# Patient Record
Sex: Male | Born: 1990
Health system: Southern US, Community
[De-identification: ages and names within clinical notes are randomized; demographics above are authoritative.]

## PROBLEM LIST (undated history)

## (undated) DIAGNOSIS — I10 Essential (primary) hypertension: Secondary | ICD-10-CM

## (undated) DIAGNOSIS — S62319A Displaced fracture of base of unspecified metacarpal bone, initial encounter for closed fracture: Secondary | ICD-10-CM

## (undated) DIAGNOSIS — I2510 Atherosclerotic heart disease of native coronary artery without angina pectoris: Secondary | ICD-10-CM

## (undated) HISTORY — PX: APPENDECTOMY: SHX54

---
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
## 2009-08-27 ENCOUNTER — Ambulatory Visit: Payer: Self-pay | Admitting: Radiology

## 2009-08-27 ENCOUNTER — Emergency Department (HOSPITAL_BASED_OUTPATIENT_CLINIC_OR_DEPARTMENT_OTHER): Admission: EM | Admit: 2009-08-27 | Discharge: 2009-08-27 | Payer: Self-pay | Admitting: Emergency Medicine

## 2013-11-14 ENCOUNTER — Emergency Department (HOSPITAL_COMMUNITY)
Admission: EM | Admit: 2013-11-14 | Discharge: 2013-11-14 | Disposition: A | Discharge status: Home / Self Care | Payer: Self-pay | Attending: Emergency Medicine | Admitting: Emergency Medicine

## 2013-11-14 ENCOUNTER — Encounter (HOSPITAL_COMMUNITY): Payer: Self-pay | Admitting: Emergency Medicine

## 2013-11-14 DIAGNOSIS — Z23 Encounter for immunization: Secondary | ICD-10-CM | POA: Insufficient documentation

## 2013-11-14 DIAGNOSIS — IMO0002 Reserved for concepts with insufficient information to code with codable children: Secondary | ICD-10-CM | POA: Insufficient documentation

## 2013-11-14 DIAGNOSIS — I10 Essential (primary) hypertension: Secondary | ICD-10-CM | POA: Insufficient documentation

## 2013-11-14 DIAGNOSIS — F17200 Nicotine dependence, unspecified, uncomplicated: Secondary | ICD-10-CM | POA: Insufficient documentation

## 2013-11-14 DIAGNOSIS — Y9389 Activity, other specified: Secondary | ICD-10-CM | POA: Insufficient documentation

## 2013-11-14 DIAGNOSIS — S01501A Unspecified open wound of lip, initial encounter: Secondary | ICD-10-CM | POA: Insufficient documentation

## 2013-11-14 DIAGNOSIS — Y9229 Other specified public building as the place of occurrence of the external cause: Secondary | ICD-10-CM | POA: Insufficient documentation

## 2013-11-14 DIAGNOSIS — S01409A Unspecified open wound of unspecified cheek and temporomandibular area, initial encounter: Secondary | ICD-10-CM | POA: Insufficient documentation

## 2013-11-14 DIAGNOSIS — S0181XA Laceration without foreign body of other part of head, initial encounter: Secondary | ICD-10-CM

## 2013-11-14 HISTORY — DX: Essential (primary) hypertension: I10

## 2013-11-14 MED ORDER — TETANUS-DIPHTH-ACELL PERTUSSIS 5-2.5-18.5 LF-MCG/0.5 IM SUSP
0.5000 mL | Freq: Once | INTRAMUSCULAR | Status: AC
  Administered 2013-11-14: 0.5 mL via INTRAMUSCULAR
  Filled 2013-11-14: qty 0.5

## 2013-11-14 MED ORDER — BACITRACIN ZINC 500 UNIT/GM EX OINT: 1.0000 "application " | TOPICAL_OINTMENT | Freq: Two times a day (BID) | CUTANEOUS | Status: DC

## 2013-11-14 MED ORDER — OXYCODONE-ACETAMINOPHEN 5-325 MG PO TABS
2.0000 | ORAL_TABLET | Freq: Once | ORAL | Status: AC
  Administered 2013-11-14: 2 via ORAL
  Filled 2013-11-14: qty 2
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
## 2013-11-14 NOTE — Discharge Instructions (Signed)
Facial Laceration ° A facial laceration is a cut on the face. These injuries can be painful and cause bleeding. Lacerations usually heal quickly, but they need special care to reduce scarring. °DIAGNOSIS  °Your health care provider will take a medical history, ask for details about how the injury occurred, and examine the wound to determine how deep the cut is. °TREATMENT  °Some facial lacerations may not require closure. Others may not be able to be closed because of an increased risk of infection. The risk of infection and the chance for successful closure will depend on various factors, including the amount of time since the injury occurred. °The wound may be cleaned to help prevent infection. If closure is appropriate, pain medicines may be given if needed. Your health care provider will use stitches (sutures), wound glue (adhesive), or skin adhesive strips to repair the laceration. These tools bring the skin edges together to allow for faster healing and a better cosmetic outcome. If needed, you may also be given a tetanus shot. °HOME CARE INSTRUCTIONS °· Only take over-the-counter or prescription medicines as directed by your health care provider. °· Follow your health care provider's instructions for wound care. These instructions will vary depending on the technique used for closing the wound. °For Sutures: °· Keep the wound clean and dry.   °· If you were given a bandage (dressing), you should change it at least once a day. Also change the dressing if it becomes wet or dirty, or as directed by your health care provider.   °· Wash the wound with soap and water 2 times a day. Rinse the wound off with water to remove all soap. Pat the wound dry with a clean towel.   °· After cleaning, apply a thin layer of the antibiotic ointment recommended by your health care provider. This will help prevent infection and keep the dressing from sticking.   °· You may shower as usual after the first 24 hours. Do not soak the  wound in water until the sutures are removed.   °· Get your sutures removed as directed by your health care provider. With facial lacerations, sutures should usually be taken out after 4-5 days to avoid stitch marks.   °· Wait a few days after your sutures are removed before applying any makeup. °For Skin Adhesive Strips: °· Keep the wound clean and dry.   °· Do not get the skin adhesive strips wet. You may bathe carefully, using caution to keep the wound dry.   °· If the wound gets wet, pat it dry with a clean towel.   °· Skin adhesive strips will fall off on their own. You may trim the strips as the wound heals. Do not remove skin adhesive strips that are still stuck to the wound. They will fall off in time.   °For Wound Adhesive: °· You may briefly wet your wound in the shower or bath. Do not soak or scrub the wound. Do not swim. Avoid periods of heavy sweating until the skin adhesive has fallen off on its own. After showering or bathing, gently pat the wound dry with a clean towel.   °· Do not apply liquid medicine, cream medicine, ointment medicine, or makeup to your wound while the skin adhesive is in place. This may loosen the film before your wound is healed.   °· If a dressing is placed over the wound, be careful not to apply tape directly over the skin adhesive. This may cause the adhesive to be pulled off before the wound is healed.   °· Avoid   prolonged exposure to sunlight or tanning lamps while the skin adhesive is in place.  The skin adhesive will usually remain in place for 5-10 days, then naturally fall off the skin. Do not pick at the adhesive film.  After Healing: Once the wound has healed, cover the wound with sunscreen during the day for 1 full year. This can help minimize scarring. Exposure to ultraviolet light in the first year will darken the scar. It can take 1-2 years for the scar to lose its redness and to heal completely.  SEEK IMMEDIATE MEDICAL CARE IF:  You have redness, pain, or  swelling around the wound.   You see ayellowish-white fluid (pus) coming from the wound.   You have chills or a fever.  MAKE SURE YOU:  Understand these instructions.  Will watch your condition.  Will get help right away if you are not doing well or get worse. Document Released: 06/08/2004 Document Revised: 02/19/2013 Document Reviewed: 12/12/2012 [facility] Patient Information 2015 [employer] [place]. This information is not intended to replace advice given to you by your health care provider. Make sure you discuss any questions you have with your health care provider.   RETURN FOR SUTURE REMOVAL ON Monday OR Tuesday OF NEXT WEEK.
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
## 2013-11-18 ENCOUNTER — Emergency Department (INDEPENDENT_AMBULATORY_CARE_PROVIDER_SITE_OTHER)
Admission: EM | Admit: 2013-11-18 | Discharge: 2013-11-18 | Disposition: A | Discharge status: Home / Self Care | Payer: Self-pay | Source: Home / Self Care | Attending: Emergency Medicine | Admitting: Emergency Medicine

## 2013-11-18 ENCOUNTER — Encounter (HOSPITAL_COMMUNITY): Payer: Self-pay | Admitting: Emergency Medicine

## 2013-11-18 DIAGNOSIS — S0180XA Unspecified open wound of other part of head, initial encounter: Secondary | ICD-10-CM

## 2013-11-18 DIAGNOSIS — S0181XD Laceration without foreign body of other part of head, subsequent encounter: Secondary | ICD-10-CM

## 2013-11-18 DIAGNOSIS — Z4802 Encounter for removal of sutures: Secondary | ICD-10-CM

## 2013-11-18 DIAGNOSIS — X58XXXA Exposure to other specified factors, initial encounter: Secondary | ICD-10-CM
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
## 2013-11-18 NOTE — Discharge Instructions (Signed)
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
## 2013-11-18 NOTE — ED Provider Notes (Signed)
Medical screening examination/treatment/procedure(s) were performed by non-physician practitioner and as supervising physician I was immediately available for consultation/collaboration.  Tiffany Talarico, M.D.  Welles Walthall C Pranavi Aure, MD 11/18/13 1831 

## 2016-01-17 ENCOUNTER — Encounter (HOSPITAL_COMMUNITY): Payer: Self-pay | Admitting: Emergency Medicine

## 2016-01-17 ENCOUNTER — Emergency Department (HOSPITAL_COMMUNITY)
Admission: EM | Admit: 2016-01-17 | Discharge: 2016-01-17 | Disposition: A | Discharge status: Home / Self Care | Payer: Self-pay | Attending: Emergency Medicine | Admitting: Emergency Medicine

## 2016-01-17 ENCOUNTER — Emergency Department (HOSPITAL_COMMUNITY): Payer: Self-pay

## 2016-01-17 DIAGNOSIS — Y929 Unspecified place or not applicable: Secondary | ICD-10-CM | POA: Insufficient documentation

## 2016-01-17 DIAGNOSIS — Y999 Unspecified external cause status: Secondary | ICD-10-CM | POA: Insufficient documentation

## 2016-01-17 DIAGNOSIS — F17200 Nicotine dependence, unspecified, uncomplicated: Secondary | ICD-10-CM | POA: Insufficient documentation

## 2016-01-17 DIAGNOSIS — I10 Essential (primary) hypertension: Secondary | ICD-10-CM | POA: Insufficient documentation

## 2016-01-17 DIAGNOSIS — Y939 Activity, unspecified: Secondary | ICD-10-CM | POA: Insufficient documentation

## 2016-01-17 DIAGNOSIS — S0101XA Laceration without foreign body of scalp, initial encounter: Secondary | ICD-10-CM | POA: Insufficient documentation

## 2016-01-17 DIAGNOSIS — S0181XA Laceration without foreign body of other part of head, initial encounter: Secondary | ICD-10-CM | POA: Insufficient documentation

## 2016-01-17 MED ORDER — LIDOCAINE-EPINEPHRINE 2 %-1:100000 IJ SOLN: 20.0000 mL | Freq: Once | INTRAMUSCULAR | Status: DC

## 2016-01-17 MED ORDER — LIDOCAINE-EPINEPHRINE (PF) 2 %-1:200000 IJ SOLN
20.0000 mL | Freq: Once | INTRAMUSCULAR | Status: AC
  Administered 2016-01-17: 20 mL via INTRADERMAL
  Filled 2016-01-17: qty 20

## 2016-01-17 MED ORDER — NAPROXEN 500 MG PO TABS: 500.0000 mg | ORAL_TABLET | Freq: Two times a day (BID) | ORAL | 0 refills | Status: DC
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
## 2016-01-17 NOTE — ED Notes (Signed)
No reaction to medication noted alert and oriented x 3 steady gait noted no respiratory or acute distress noted.

## 2016-08-13 DIAGNOSIS — S62319A Displaced fracture of base of unspecified metacarpal bone, initial encounter for closed fracture: Secondary | ICD-10-CM

## 2016-08-13 HISTORY — DX: Displaced fracture of base of unspecified metacarpal bone, initial encounter for closed fracture: S62.319A

## 2016-08-15 ENCOUNTER — Emergency Department (HOSPITAL_COMMUNITY): Payer: Self-pay

## 2016-08-15 ENCOUNTER — Encounter (HOSPITAL_COMMUNITY): Payer: Self-pay | Admitting: Emergency Medicine

## 2016-08-15 ENCOUNTER — Emergency Department (HOSPITAL_COMMUNITY)
Admission: EM | Admit: 2016-08-15 | Discharge: 2016-08-15 | Disposition: A | Discharge status: Home / Self Care | Payer: Self-pay | Attending: Emergency Medicine | Admitting: Emergency Medicine

## 2016-08-15 DIAGNOSIS — I10 Essential (primary) hypertension: Secondary | ICD-10-CM | POA: Insufficient documentation

## 2016-08-15 DIAGNOSIS — Z79899 Other long term (current) drug therapy: Secondary | ICD-10-CM | POA: Insufficient documentation

## 2016-08-15 DIAGNOSIS — Y939 Activity, unspecified: Secondary | ICD-10-CM | POA: Insufficient documentation

## 2016-08-15 DIAGNOSIS — Y999 Unspecified external cause status: Secondary | ICD-10-CM | POA: Insufficient documentation

## 2016-08-15 DIAGNOSIS — S62309A Unspecified fracture of unspecified metacarpal bone, initial encounter for closed fracture: Secondary | ICD-10-CM

## 2016-08-15 DIAGNOSIS — F17200 Nicotine dependence, unspecified, uncomplicated: Secondary | ICD-10-CM | POA: Insufficient documentation

## 2016-08-15 DIAGNOSIS — S62316A Displaced fracture of base of fifth metacarpal bone, right hand, initial encounter for closed fracture: Secondary | ICD-10-CM | POA: Insufficient documentation

## 2016-08-15 DIAGNOSIS — S62324A Displaced fracture of shaft of fourth metacarpal bone, right hand, initial encounter for closed fracture: Secondary | ICD-10-CM | POA: Insufficient documentation

## 2016-08-15 DIAGNOSIS — Y929 Unspecified place or not applicable: Secondary | ICD-10-CM | POA: Insufficient documentation

## 2016-08-15 MED ORDER — HYDROCODONE-ACETAMINOPHEN 5-325 MG PO TABS: 1.0000 | ORAL_TABLET | ORAL | 0 refills | Status: AC | PRN

## 2016-08-15 MED ORDER — HYDROCODONE-ACETAMINOPHEN 5-325 MG PO TABS
1.0000 | ORAL_TABLET | Freq: Once | ORAL | Status: AC
Start: 2016-08-15 — End: 2016-08-15
  Administered 2016-08-15: 1 via ORAL
  Filled 2016-08-15: qty 1
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
## 2016-08-15 NOTE — Progress Notes (Signed)
Orthopedic Tech Progress Note Patient Details:  Alec Henderson [birth year] [id number]  Ortho Devices Type of Ortho Device: Arm sling, Ulna gutter splint Ortho Device/Splint Location: rue Ortho Device/Splint Interventions: Ordered, Application   Trinna Post 08/15/2016, 4:07 AM

## 2016-08-15 NOTE — ED Notes (Signed)
Patient a/o x4 and denies pain upon discharge. RN went over AVS with patient and all questions were answered.
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
## 2016-08-15 NOTE — Discharge Instructions (Signed)
Dr. Carollee Massed office will call you tomorrow. Wear the splint as prescribed. Follow up with Dr. Janee Morn. Return to the ED if you develop new or worsening symptoms.

## 2016-08-16 ENCOUNTER — Other Ambulatory Visit: Payer: Self-pay | Admitting: Orthopedic Surgery

## 2016-08-16 ENCOUNTER — Encounter (HOSPITAL_BASED_OUTPATIENT_CLINIC_OR_DEPARTMENT_OTHER): Payer: Self-pay | Admitting: *Deleted
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
## 2016-08-21 ENCOUNTER — Ambulatory Visit (HOSPITAL_BASED_OUTPATIENT_CLINIC_OR_DEPARTMENT_OTHER): Payer: Self-pay | Admitting: Anesthesiology

## 2016-08-21 ENCOUNTER — Encounter (HOSPITAL_BASED_OUTPATIENT_CLINIC_OR_DEPARTMENT_OTHER): Payer: Self-pay | Admitting: Anesthesiology

## 2016-08-21 ENCOUNTER — Ambulatory Visit (HOSPITAL_BASED_OUTPATIENT_CLINIC_OR_DEPARTMENT_OTHER)
Admission: RE | Admit: 2016-08-21 | Discharge: 2016-08-21 | Disposition: A | Discharge status: Home / Self Care | Payer: Self-pay | Source: Ambulatory Visit | Attending: Orthopedic Surgery | Admitting: Orthopedic Surgery

## 2016-08-21 ENCOUNTER — Ambulatory Visit (HOSPITAL_COMMUNITY): Payer: Self-pay

## 2016-08-21 ENCOUNTER — Encounter (HOSPITAL_BASED_OUTPATIENT_CLINIC_OR_DEPARTMENT_OTHER)
Admission: RE | Disposition: A | Discharge status: Home / Self Care | Payer: Self-pay | Source: Ambulatory Visit | Attending: Orthopedic Surgery

## 2016-08-21 DIAGNOSIS — S62324A Displaced fracture of shaft of fourth metacarpal bone, right hand, initial encounter for closed fracture: Secondary | ICD-10-CM | POA: Insufficient documentation

## 2016-08-21 DIAGNOSIS — W228XXA Striking against or struck by other objects, initial encounter: Secondary | ICD-10-CM | POA: Insufficient documentation

## 2016-08-21 DIAGNOSIS — X58XXXA Exposure to other specified factors, initial encounter: Secondary | ICD-10-CM | POA: Insufficient documentation

## 2016-08-21 DIAGNOSIS — S62316A Displaced fracture of base of fifth metacarpal bone, right hand, initial encounter for closed fracture: Secondary | ICD-10-CM | POA: Insufficient documentation

## 2016-08-21 DIAGNOSIS — F17210 Nicotine dependence, cigarettes, uncomplicated: Secondary | ICD-10-CM | POA: Insufficient documentation

## 2016-08-21 DIAGNOSIS — Z419 Encounter for procedure for purposes other than remedying health state, unspecified: Secondary | ICD-10-CM

## 2016-08-21 HISTORY — DX: Displaced fracture of base of unspecified metacarpal bone, initial encounter for closed fracture: S62.319A

## 2016-08-21 HISTORY — PX: CLOSED REDUCTION FINGER WITH PERCUTANEOUS PINNING: SHX5612

## 2016-08-21 SURGERY — CLOSED REDUCTION, FINGER, WITH PERCUTANEOUS PINNING
Anesthesia: General | Site: Finger | Laterality: Right

## 2016-08-21 MED ORDER — PROPOFOL 500 MG/50ML IV EMUL
INTRAVENOUS | Status: AC
  Filled 2016-08-21: qty 50

## 2016-08-21 MED ORDER — METOCLOPRAMIDE HCL 5 MG/ML IJ SOLN: 10.0000 mg | Freq: Once | INTRAMUSCULAR | Status: DC | PRN

## 2016-08-21 MED ORDER — LACTATED RINGERS IV SOLN
INTRAVENOUS | Status: DC
  Administered 2016-08-21: 09:00:00 via INTRAVENOUS

## 2016-08-21 MED ORDER — SCOPOLAMINE 1 MG/3DAYS TD PT72: 1.0000 | MEDICATED_PATCH | Freq: Once | TRANSDERMAL | Status: DC | PRN

## 2016-08-21 MED ORDER — OXYCODONE HCL 5 MG PO TABS: 5.0000 mg | ORAL_TABLET | Freq: Four times a day (QID) | ORAL | 0 refills | Status: AC | PRN

## 2016-08-21 MED ORDER — MIDAZOLAM HCL 2 MG/2ML IJ SOLN
INTRAMUSCULAR | Status: AC
  Filled 2016-08-21: qty 2

## 2016-08-21 MED ORDER — LIDOCAINE 2% (20 MG/ML) 5 ML SYRINGE
INTRAMUSCULAR | Status: AC
  Filled 2016-08-21: qty 5

## 2016-08-21 MED ORDER — FENTANYL CITRATE (PF) 100 MCG/2ML IJ SOLN
INTRAMUSCULAR | Status: AC
  Filled 2016-08-21: qty 2

## 2016-08-21 MED ORDER — ACETAMINOPHEN 325 MG PO TABS: 650.0000 mg | ORAL_TABLET | Freq: Four times a day (QID) | ORAL | Status: AC | PRN

## 2016-08-21 MED ORDER — FENTANYL CITRATE (PF) 100 MCG/2ML IJ SOLN: 25.0000 ug | INTRAMUSCULAR | Status: DC | PRN

## 2016-08-21 MED ORDER — IBUPROFEN 200 MG PO TABS: 600.0000 mg | ORAL_TABLET | Freq: Four times a day (QID) | ORAL | 0 refills | Status: AC | PRN

## 2016-08-21 MED ORDER — SUCCINYLCHOLINE CHLORIDE 200 MG/10ML IV SOSY
PREFILLED_SYRINGE | INTRAVENOUS | Status: AC
  Filled 2016-08-21: qty 10

## 2016-08-21 MED ORDER — DEXAMETHASONE SODIUM PHOSPHATE 10 MG/ML IJ SOLN
INTRAMUSCULAR | Status: DC | PRN
  Administered 2016-08-21: 10 mg via INTRAVENOUS

## 2016-08-21 MED ORDER — CEFAZOLIN SODIUM-DEXTROSE 2-4 GM/100ML-% IV SOLN
2.0000 g | INTRAVENOUS | Status: AC
  Administered 2016-08-21: 2 g via INTRAVENOUS

## 2016-08-21 MED ORDER — FENTANYL CITRATE (PF) 100 MCG/2ML IJ SOLN
50.0000 ug | INTRAMUSCULAR | Status: AC | PRN
  Administered 2016-08-21: 50 ug via INTRAVENOUS
  Administered 2016-08-21: 100 ug via INTRAVENOUS
  Administered 2016-08-21: 50 ug via INTRAVENOUS

## 2016-08-21 MED ORDER — EPHEDRINE 5 MG/ML INJ
INTRAVENOUS | Status: AC
Start: 2016-08-21 — End: 2016-08-21
  Filled 2016-08-21: qty 10

## 2016-08-21 MED ORDER — MIDAZOLAM HCL 2 MG/2ML IJ SOLN
1.0000 mg | INTRAMUSCULAR | Status: DC | PRN
  Administered 2016-08-21 (×2): 1 mg via INTRAVENOUS
  Administered 2016-08-21: 2 mg via INTRAVENOUS

## 2016-08-21 MED ORDER — LACTATED RINGERS IV SOLN: INTRAVENOUS | Status: DC

## 2016-08-21 MED ORDER — DEXAMETHASONE SODIUM PHOSPHATE 10 MG/ML IJ SOLN
INTRAMUSCULAR | Status: AC
  Filled 2016-08-21: qty 1

## 2016-08-21 MED ORDER — LACTATED RINGERS IV SOLN
INTRAVENOUS | Status: DC
  Administered 2016-08-21 (×2): via INTRAVENOUS

## 2016-08-21 MED ORDER — CEFAZOLIN SODIUM-DEXTROSE 2-4 GM/100ML-% IV SOLN
INTRAVENOUS | Status: AC
  Filled 2016-08-21: qty 100

## 2016-08-21 MED ORDER — PHENYLEPHRINE 40 MCG/ML (10ML) SYRINGE FOR IV PUSH (FOR BLOOD PRESSURE SUPPORT)
PREFILLED_SYRINGE | INTRAVENOUS | Status: AC
  Filled 2016-08-21: qty 10

## 2016-08-21 MED ORDER — BUPIVACAINE-EPINEPHRINE (PF) 0.5% -1:200000 IJ SOLN
INTRAMUSCULAR | Status: DC | PRN
  Administered 2016-08-21: 30 mL via PERINEURAL

## 2016-08-21 MED ORDER — ONDANSETRON HCL 4 MG/2ML IJ SOLN
INTRAMUSCULAR | Status: AC
  Filled 2016-08-21: qty 2

## 2016-08-21 MED ORDER — MEPERIDINE HCL 25 MG/ML IJ SOLN: 6.2500 mg | INTRAMUSCULAR | Status: DC | PRN

## 2016-08-21 MED ORDER — PROPOFOL 10 MG/ML IV BOLUS
INTRAVENOUS | Status: DC | PRN
  Administered 2016-08-21: 100 mg via INTRAVENOUS
  Administered 2016-08-21: 200 mg via INTRAVENOUS

## 2016-08-21 SURGICAL SUPPLY — 50 items
BLADE MINI RND TIP GREEN BEAV (BLADE) IMPLANT
BLADE SURG 15 STRL LF DISP TIS (BLADE) ×1 IMPLANT
BLADE SURG 15 STRL SS (BLADE) ×2
BNDG COHESIVE 4X5 TAN STRL (GAUZE/BANDAGES/DRESSINGS) ×3 IMPLANT
BNDG ESMARK 4X9 LF (GAUZE/BANDAGES/DRESSINGS) ×3 IMPLANT
BNDG GAUZE ELAST 4 BULKY (GAUZE/BANDAGES/DRESSINGS) ×3 IMPLANT
CANISTER SUCTION 1200CC (MISCELLANEOUS) IMPLANT
CHLORAPREP W/TINT 26ML (MISCELLANEOUS) ×3 IMPLANT
CORDS BIPOLAR (ELECTRODE) ×3 IMPLANT
COVER BACK TABLE 60X90IN (DRAPES) ×3 IMPLANT
COVER MAYO STAND STRL (DRAPES) ×3 IMPLANT
CUFF TOURNIQUET SINGLE 18IN (TOURNIQUET CUFF) ×3 IMPLANT
DRAPE C-ARM 42X72 X-RAY (DRAPES) ×3 IMPLANT
DRAPE EXTREMITY T 121X128X90 (DRAPE) ×3 IMPLANT
DRAPE SURG 17X23 STRL (DRAPES) ×3 IMPLANT
DRSG EMULSION OIL 3X3 NADH (GAUZE/BANDAGES/DRESSINGS) ×3 IMPLANT
GAUZE SPONGE 4X4 12PLY STRL LF (GAUZE/BANDAGES/DRESSINGS) ×3 IMPLANT
GLOVE BIO SURGEON STRL SZ7.5 (GLOVE) ×3 IMPLANT
GLOVE BIOGEL PI IND STRL 7.0 (GLOVE) ×2 IMPLANT
GLOVE BIOGEL PI IND STRL 8 (GLOVE) ×2 IMPLANT
GLOVE BIOGEL PI INDICATOR 7.0 (GLOVE) ×4
GLOVE BIOGEL PI INDICATOR 8 (GLOVE) ×4
GLOVE ECLIPSE 6.5 STRL STRAW (GLOVE) ×6 IMPLANT
GOWN STRL REUS W/ TWL LRG LVL3 (GOWN DISPOSABLE) ×2 IMPLANT
GOWN STRL REUS W/TWL LRG LVL3 (GOWN DISPOSABLE) ×4
GOWN STRL REUS W/TWL XL LVL3 (GOWN DISPOSABLE) ×3 IMPLANT
K-WIRE .045X4 (WIRE) ×12 IMPLANT
K-WIRE .062X4 (WIRE) ×3 IMPLANT
NEEDLE HYPO 25X1 1.5 SAFETY (NEEDLE) IMPLANT
NS IRRIG 1000ML POUR BTL (IV SOLUTION) ×3 IMPLANT
PACK BASIN DAY SURGERY FS (CUSTOM PROCEDURE TRAY) ×3 IMPLANT
PADDING CAST ABS 4INX4YD NS (CAST SUPPLIES)
PADDING CAST ABS COTTON 4X4 ST (CAST SUPPLIES) IMPLANT
SLEEVE SCD COMPRESS KNEE MED (MISCELLANEOUS) ×3 IMPLANT
SLING ARM FOAM STRAP LRG (SOFTGOODS) ×3 IMPLANT
SPLINT PLASTER CAST XFAST 3X15 (CAST SUPPLIES) ×11 IMPLANT
SPLINT PLASTER XTRA FASTSET 3X (CAST SUPPLIES) ×22
STOCKINETTE 6  STRL (DRAPES) ×2
STOCKINETTE 6 STRL (DRAPES) ×1 IMPLANT
SUCTION FRAZIER HANDLE 10FR (MISCELLANEOUS)
SUCTION TUBE FRAZIER 10FR DISP (MISCELLANEOUS) IMPLANT
SUT VICRYL RAPIDE 4-0 (SUTURE) IMPLANT
SUT VICRYL RAPIDE 4/0 PS 2 (SUTURE) IMPLANT
SYR 10ML LL (SYRINGE) IMPLANT
SYR BULB 3OZ (MISCELLANEOUS) ×3 IMPLANT
TOWEL OR 17X24 6PK STRL BLUE (TOWEL DISPOSABLE) ×3 IMPLANT
TOWEL OR NON WOVEN STRL DISP B (DISPOSABLE) IMPLANT
TUBE CONNECTING 20'X1/4 (TUBING)
TUBE CONNECTING 20X1/4 (TUBING) IMPLANT
UNDERPAD 30X30 (UNDERPADS AND DIAPERS) ×3 IMPLANT

## 2016-08-21 NOTE — Anesthesia Preprocedure Evaluation (Signed)
Anesthesia Evaluation  Patient identified by MRN, date of birth, ID band Patient awake    Reviewed: Allergy & Precautions, NPO status , Patient's Chart, lab work & pertinent test results  Airway Mallampati: II  TM Distance: >3 FB Neck ROM: Full    Dental no notable dental hx.    Pulmonary Current Smoker,    Pulmonary exam normal breath sounds clear to auscultation       Cardiovascular hypertension, Normal cardiovascular exam Rhythm:Regular Rate:Normal     Neuro/Psych negative neurological ROS  negative psych ROS   GI/Hepatic negative GI ROS, Neg liver ROS,   Endo/Other  negative endocrine ROS  Renal/GU negative Renal ROS  negative genitourinary   Musculoskeletal negative musculoskeletal ROS (+)   Abdominal   Peds negative pediatric ROS (+)  Hematology negative hematology ROS (+)   Anesthesia Other Findings   Reproductive/Obstetrics negative OB ROS                             Anesthesia Physical Anesthesia Plan  ASA: II  Anesthesia Plan: General   Post-op Pain Management:  Regional for Post-op pain   Induction: Intravenous  Airway Management Planned: LMA  Additional Equipment:   Intra-op Plan:   Post-operative Plan: Extubation in OR  Informed Consent: I have reviewed the patients History and Physical, chart, labs and discussed the procedure including the risks, benefits and alternatives for the proposed anesthesia with the patient or authorized representative who has indicated his/her understanding and acceptance.   Dental advisory given  Plan Discussed with: CRNA  Anesthesia Plan Comments: (SCB)        Anesthesia Quick Evaluation

## 2016-08-21 NOTE — Progress Notes (Signed)
Assisted Dr. Carignan with right, ultrasound guided, supraclavicular block. Side rails up, monitors on throughout procedure. See vital signs in flow sheet. Tolerated Procedure well. 

## 2016-08-21 NOTE — Op Note (Signed)
08/21/2016  10:32 AM  PATIENT:  Alec Henderson  26 y.o. male  PRE-OPERATIVE DIAGNOSIS:  Displaced R 4th MC shaft fx and 5th MC base fx  POST-OPERATIVE DIAGNOSIS:  Same  PROCEDURE:   1. CRPP R 4 MC shaft fx    2. CRPP R 5 MC base fx  SURGEON: Antonia Culbertson A. Janee Morn, MD  PHYSICIAN ASSISTANT: Danielle Rankin, OPA-C  ANESTHESIA:  regional and general  SPECIMENS:  None  DRAINS:   None  EBL:  less than 50 mL  PREOPERATIVE INDICATIONS:  Alec Henderson is a  26 y.o. male with displaced fractures of the right fourth and fifth metacarpals  The risks benefits and alternatives were discussed with the patient preoperatively including but not limited to the risks of infection, bleeding, nerve injury, cardiopulmonary complications, the need for revision surgery, among others, and the patient verbalized understanding and consented to proceed.  OPERATIVE IMPLANTS: 0.062 inch K wire 1, 0.045 inch K wires 2  OPERATIVE PROCEDURE:  After receiving prophylactic antibiotics and a regional block, the patient was escorted to the operative theatre and placed in a supine position.  General anesthesia was administered.  A surgical "time-out" was performed during which the planned procedure, proposed operative site, and the correct patient identity were compared to the operative consent and agreement confirmed by the circulating nurse according to current facility policy.  Following application of a tourniquet to the operative extremity, the exposed skin was pre-scrubbed with a Hibiclens scrub brush before being formally prepped with Chloraprep and draped in the usual sterile fashion.  The limb was exsanguinated with an Esmarch bandage and the tourniquet inflated to approximately higher than systolic BP.  The base of the fourth metacarpal was marked fluoroscopically and a small stab incision made just proximal to it.  Spreading dissection was carried down to the dorsal cortex of the base of the fourth metacarpal  and a 0.062 inch K wire was used to drive an oblique hole into the fourth metacarpal, entering the canal.  The K wire was removed, the sharp and clipped off and bent into a skid shape, and it was reinserted by hand into chock down the shaft of the metacarpal, across the fracture site while maintaining reduction, and into the distal aspect of the metacarpal.  Axial load was applied to compress the fracture site and the K wire was bent 90, 90 and clipped.  The base of the fifth metacarpal fracture was then reduced with a towel clip and longitudinal traction.  A single 0.045 inch K wire was driven across the base fragments to help secure the articular reduction and then another transverse K wire was driven from the proximal shaft of the fifth into the proximal shaft of the fourth, with longitudinal traction applied.  This would help neutralize displacement forces at the base.  These K wires were bent over at the skin and clipped.  Final fluoroscopic images were obtained and a short arm splint dressing was applied with both volar and dorsal plaster component.  The digits had excellent rotation and were free to move.  He was awakened and taken to the recovery room in stable condition, breathing spontaneously  DISPOSITION : He will be discharged home today with typical postop instructions, returning in 10-15 days with new x-rays of right hand out of splint and conversion to short arm cast.

## 2016-08-21 NOTE — Discharge Instructions (Signed)
Discharge Instructions   You have a dressing with a plaster splint incorporated in it. Move your fingers as much as possible, making a full fist and fully opening the fist. Elevate your hand above your elbow to reduce pain & swelling of the digits.  Ice over the operative site or in your arm pit may be helpful to reduce pain & swelling.  DO NOT USE HEAT. Take 600 mg ibuprofen and 650 mg of Tylenol EVERY 6 hours OTC. Utilize the oxycodone for SEVERE breakthrough pain only.  Leave the dressing in place until you return to our office.  You may shower, but keep the bandage clean & dry.  You may drive a car when you are off of prescription pain medications and can safely control your vehicle with both hands. Make an appointment for 10-15 days from the date of surgery.   Please call [phone] during normal business hours or [phone] after hours for any problems. Including the following:  - excessive redness of the incisions - drainage for more than 4 days - fever of more than 101.5 F  *Please note that pain medications will not be refilled after hours or on weekends.  Regional Anesthesia Blocks  1. Numbness or the inability to move the "blocked" extremity may last from 3-48 hours after placement. The length of time depends on the medication injected and your individual response to the medication. If the numbness is not going away after 48 hours, call your surgeon.  2. The extremity that is blocked will need to be protected until the numbness is gone and the  Strength has returned. Because you cannot feel it, you will need to take extra care to avoid injury. Because it may be weak, you may have difficulty moving it or using it. You may not know what position it is in without looking at it while the block is in effect.  3. For blocks in the legs and feet, returning to weight bearing and walking needs to be done carefully. You will need to wait until the numbness is entirely gone and the  strength has returned. You should be able to move your leg and foot normally before you try and bear weight or walk. You will need someone to be with you when you first try to ensure you do not fall and possibly risk injury.  4. Bruising and tenderness at the needle site are common side effects and will resolve in a few days.  5. Persistent numbness or new problems with movement should be communicated to the surgeon or the [facility] [phone] [facility] [phone]).   Post Anesthesia Home Care Instructions  Activity: Get plenty of rest for the remainder of the day. A responsible individual must stay with you for 24 hours following the procedure.  For the next 24 hours, DO NOT: -Drive a car -Advertising copywriter -Drink alcoholic beverages -Take any medication unless instructed by your physician -Make any legal decisions or sign important papers.  Meals: Start with liquid foods such as gelatin or soup. Progress to regular foods as tolerated. Avoid greasy, spicy, heavy foods. If nausea and/or vomiting occur, drink only clear liquids until the nausea and/or vomiting subsides. Call your physician if vomiting continues.  Special Instructions/Symptoms: Your throat may feel dry or sore from the anesthesia or the breathing tube placed in your throat during surgery. If this causes discomfort, gargle with warm salt water. The discomfort should disappear within 24 hours.  If you had a  scopolamine patch placed behind your ear for the management of post- operative nausea and/or vomiting:  1. The medication in the patch is effective for 72 hours, after which it should be removed.  Wrap patch in a tissue and discard in the trash. Wash hands thoroughly with soap and water. 2. You may remove the patch earlier than 72 hours if you experience unpleasant side effects which may include dry mouth, dizziness or visual disturbances. 3. Avoid touching the patch. Wash your hands  with soap and water after contact with the patch.

## 2016-08-21 NOTE — Anesthesia Procedure Notes (Signed)
Procedure Name: LMA Insertion Date/Time: 08/21/2016 10:48 AM Performed by: Zenia Resides D Pre-anesthesia Checklist: Patient identified, Emergency Drugs available, Suction available and Patient being monitored Patient Re-evaluated:Patient Re-evaluated prior to inductionOxygen Delivery Method: Circle system utilized Preoxygenation: Pre-oxygenation with 100% oxygen Intubation Type: IV induction Ventilation: Mask ventilation without difficulty LMA: LMA inserted LMA Size: 4.0 Number of attempts: 1 Airway Equipment and Method: Bite block Placement Confirmation: positive ETCO2 Tube secured with: Tape Dental Injury: Teeth and Oropharynx as per pre-operative assessment

## 2016-08-21 NOTE — Transfer of Care (Signed)
Immediate Anesthesia Transfer of Care Note  Patient: Alec Henderson  Procedure(s) Performed: Procedure(s) with comments: OPEN TREATMENT RIGHT 4TH AND 5TH METACARPAL FRACTURES VS. PINNING (Right) - GENERAL ANESTHESIA WITH PRE-OP BLOCK  Patient Location: PACU  Anesthesia Type:General  Level of Consciousness: sedated  Airway & Oxygen Therapy: Patient Spontanous Breathing and Patient connected to face mask oxygen  Post-op Assessment: Report given to RN and Post -op Vital signs reviewed and stable  Post vital signs: Reviewed and stable  Last Vitals:  Vitals:   08/21/16 0945 08/21/16 1000  BP: (!) 141/79 (!) 160/99  Pulse: 74 75  Resp: 20 18  Temp:      Last Pain:  Vitals:   08/21/16 0914  TempSrc: Oral  PainSc: 9          Complications: No apparent anesthesia complications

## 2016-08-21 NOTE — Interval H&P Note (Signed)
History and Physical Interval Note:  08/21/2016 10:32 AM  Alec Henderson  has presented today for surgery, with the diagnosis of RIGHT 4TH AND 5TH BASE METACARPAL FRACTURES S62.354A, S62.616A  The various methods of treatment have been discussed with the patient and family. After consideration of risks, benefits and other options for treatment, the patient has consented to  Procedure(s) with comments: OPEN TREATMENT RIGHT 4TH AND 5TH METACARPAL FRACTURES VS. PINNING (Right) - GENERAL ANESTHESIA WITH PRE-OP BLOCK as a surgical intervention .  The patient's history has been reviewed, patient examined, no change in status, stable for surgery.  I have reviewed the patient's chart and labs.  Questions were answered to the patient's satisfaction.     Ronnell Clinger A.

## 2016-08-21 NOTE — Anesthesia Postprocedure Evaluation (Signed)
Anesthesia Post Note  Patient: Berkley Cronkright  Procedure(s) Performed: Procedure(s) (LRB): CLOSED REDUCTION AND PINNING, FOURTH AND FIFTH METACARPLES (Right)  Patient location during evaluation: PACU Anesthesia Type: General and Regional Level of consciousness: awake and alert Pain management: pain level controlled Vital Signs Assessment: post-procedure vital signs reviewed and stable Respiratory status: spontaneous breathing, nonlabored ventilation, respiratory function stable and patient connected to nasal cannula oxygen Cardiovascular status: blood pressure returned to baseline and stable Postop Assessment: no signs of nausea or vomiting Anesthetic complications: no       Last Vitals:  Vitals:   08/21/16 1215 08/21/16 1233  BP: 136/90 (!) 144/91  Pulse: 74 74  Resp: 17 16  Temp:  36.4 C    Last Pain:  Vitals:   08/21/16 1233  TempSrc: Oral  PainSc: 0-No pain                 Phillips Grout

## 2016-08-21 NOTE — Anesthesia Procedure Notes (Signed)
Anesthesia Regional Block: Supraclavicular block   Pre-Anesthetic Checklist: ,, timeout performed, Correct Patient, Correct Site, Correct Laterality, Correct Procedure, Correct Position, site marked, Risks and benefits discussed,  Surgical consent,  Pre-op evaluation,  At surgeon's request and post-op pain management  Laterality: Right and Upper  Prep: Maximum Sterile Barrier Precautions used, chloraprep       Needles:  Injection technique: Single-shot  Needle Type: Echogenic Stimulator Needle     Needle Length: 10cm      Additional Needles:   Procedures: ultrasound guided,,,,,,,,  Narrative:  Start time: 08/21/2016 9:42 AM End time: 08/21/2016 9:52 AM Injection made incrementally with aspirations every 5 mL.  Performed by: Personally  Anesthesiologist: Phillips Grout  Additional Notes: Risks, benefits and alternative to block explained extensively.  Patient tolerated procedure well, without complications.

## 2016-08-22 ENCOUNTER — Encounter (HOSPITAL_BASED_OUTPATIENT_CLINIC_OR_DEPARTMENT_OTHER): Payer: Self-pay | Admitting: Orthopedic Surgery

## 2017-05-13 ENCOUNTER — Emergency Department (HOSPITAL_COMMUNITY): Payer: Self-pay

## 2017-05-13 ENCOUNTER — Encounter (HOSPITAL_COMMUNITY): Payer: Self-pay | Admitting: Emergency Medicine

## 2017-05-13 ENCOUNTER — Emergency Department (HOSPITAL_COMMUNITY)
Admission: EM | Admit: 2017-05-13 | Discharge: 2017-05-14 | Disposition: A | Discharge status: Home / Self Care | Payer: Self-pay | Attending: Emergency Medicine | Admitting: Emergency Medicine

## 2017-05-13 DIAGNOSIS — Y929 Unspecified place or not applicable: Secondary | ICD-10-CM | POA: Insufficient documentation

## 2017-05-13 DIAGNOSIS — F17210 Nicotine dependence, cigarettes, uncomplicated: Secondary | ICD-10-CM | POA: Insufficient documentation

## 2017-05-13 DIAGNOSIS — Y939 Activity, unspecified: Secondary | ICD-10-CM | POA: Insufficient documentation

## 2017-05-13 DIAGNOSIS — W2209XA Striking against other stationary object, initial encounter: Secondary | ICD-10-CM | POA: Insufficient documentation

## 2017-05-13 DIAGNOSIS — Y999 Unspecified external cause status: Secondary | ICD-10-CM | POA: Insufficient documentation

## 2017-05-13 DIAGNOSIS — S62354A Nondisplaced fracture of shaft of fourth metacarpal bone, right hand, initial encounter for closed fracture: Secondary | ICD-10-CM | POA: Insufficient documentation

## 2017-05-13 DIAGNOSIS — S62356A Nondisplaced fracture of shaft of fifth metacarpal bone, right hand, initial encounter for closed fracture: Secondary | ICD-10-CM | POA: Insufficient documentation

## 2017-05-13 NOTE — ED Triage Notes (Signed)
Patient c/o right hand pain after punching a wall this morning. Hx fracture and surgery to right hand. Swelling noted. Movement and sensation to all fingers.

## 2017-05-14 MED ORDER — ACETAMINOPHEN 500 MG PO TABS
1000.0000 mg | ORAL_TABLET | Freq: Once | ORAL | Status: AC
  Administered 2017-05-14: 1000 mg via ORAL
  Filled 2017-05-14: qty 2

## 2017-05-14 NOTE — Progress Notes (Signed)
Orthopedic Tech Progress Note Patient Details:  Dema SeverinKeith Sow 1990-09-26 [id number]  Ortho Devices Type of Ortho Device: Arm sling, Ulna gutter splint Ortho Device/Splint Location: rue Ortho Device/Splint Interventions: Ordered, Application, Adjustment   Post Interventions Patient Tolerated: Well Instructions Provided: Care of device, Adjustment of device   Trinna PostMartinez, Loni Delbridge J 05/14/2017, 1:04 AM

## 2017-05-14 NOTE — ED Provider Notes (Signed)
[facility]   CSN: [id number] Arrival date & time: 05/13/17  2248     History   Chief Complaint Chief Complaint  Patient presents with  . Hand Pain    HPI Alec Henderson is a 26 y.o. male who presents for evaluation of right hand pain that began this morning. Patient reports he was "playing around" with some friends this afternoon and ended up punching a wall. He notes some associated swelling to the area. No meds for pain prior to ED arrival. Denies numbness.   The history is provided by the patient.    Past Medical History:  Diagnosis Date  . Closed fracture of metacarpal base of finger 08/2016   right 4th and 5th  . Hypertension    no meds. in 6 mos., per pt.    There are no active problems to display for this patient.   Past Surgical History:  Procedure Laterality Date  . APPENDECTOMY    . CLOSED REDUCTION FINGER WITH PERCUTANEOUS PINNING Right 08/21/2016   Procedure: CLOSED REDUCTION AND PINNING, FOURTH AND FIFTH METACARPLES;  Surgeon: Mack Hookavid Thompson, MD;  Location: [facility];  Service: Orthopedics;  Laterality: Right;  GENERAL ANESTHESIA WITH PRE-OP BLOCK       Home Medications    Prior to Admission medications   Medication Sig Start Date End Date Taking? Authorizing Provider  acetaminophen (TYLENOL) 325 MG tablet Take 2 tablets (650 mg total) by mouth every 6 (six) hours as needed for mild pain or moderate pain. 08/21/16   Mack Hookhompson, David, MD  HYDROcodone-acetaminophen (NORCO/VICODIN) 5-325 MG tablet Take 1 tablet by mouth every 4 (four) hours as needed. 08/15/16   Rancour, Jeannett SeniorStephen, MD  ibuprofen (ADVIL) 200 MG tablet Take 3 tablets (600 mg total) by mouth every 6 (six) hours as needed for moderate pain. 08/21/16   Mack Hookhompson, David, MD  oxyCODONE (ROXICODONE) 5 MG immediate release tablet Take 1 tablet (5 mg total) by mouth every 6 (six) hours as needed for breakthrough pain. 08/21/16   Mack Hookhompson, David, MD     Family History No family history on file.  Social History Social History   Tobacco Use  . Smoking status: Current Every Day Smoker    Packs/day: 1.00    Years: 4.00    Pack years: 4.00    Types: Cigarettes  . Smokeless tobacco: Never Used  Substance Use Topics  . Alcohol use: No  . Drug use: No     Allergies   Patient has no known allergies.   Review of Systems Review of Systems  Musculoskeletal:       Right hand pain  Neurological: Negative for numbness.     Physical Exam Updated Vital Signs BP (!) 154/99 (BP Location: Left Arm)   Pulse 86   Temp 98.7 F (37.1 C) (Oral)   Resp 18   SpO2 98%   Physical Exam  Constitutional: He appears well-developed and well-nourished.  HENT:  Head: Normocephalic and atraumatic.  Eyes: Conjunctivae and EOM are normal. Right eye exhibits no discharge. Left eye exhibits no discharge. No scleral icterus.  Cardiovascular:  Pulses:      Radial pulses are 2+ on the right side, and 2+ on the left side.  Pulmonary/Chest: Effort normal.  Musculoskeletal:  Tenderness palpation overlying the third, fourth and fifth metacarpals of the right hand.  There is overlying soft tissue swelling.  Mild tenderness to palpation to the distal fourth and fifth phalanx.  No deformity or crepitus  noted.  No tenderness palpation to the ulnar or radial aspect of the right wrist.  Good flexion/extension of wrist without difficulty.  Patient is able to make a fist though does report subjective reports of pain with flexion of fourth and fifth digits.  Extension of all 5 digits intact without difficulty.  No abnormalities of the left upper extremity.  Neurological: He is alert.  Skin: Skin is warm and dry. Capillary refill takes less than 2 seconds.  RUE is not dusky in appearance or cool to touch. Good distal cap refill.   Psychiatric: He has a normal mood and affect. His speech is normal and behavior is normal.  Nursing note and vitals  reviewed.    ED Treatments / Results  Labs (all labs ordered are listed, but only abnormal results are displayed) Labs Reviewed - No data to display  EKG  EKG Interpretation None       Radiology Dg Hand Complete Right  Result Date: 05/13/2017 CLINICAL DATA:  Fourth and fifth metacarpal pain after punching a wall. EXAM: RIGHT HAND - COMPLETE 3+ VIEW COMPARISON:  08/15/2016 and 08/21/2016 FINDINGS: Acute transverse fractures of the midshaft of the right fourth metacarpal bone and of the proximal shaft of the right fifth metacarpal bone. Mild varus angulation of the distal fracture fragments. No articular involvement or dislocation. Soft tissue swelling. No additional fractures identified. No destructive or expansile bone lesions. IMPRESSION: Transverse fractures of the right fourth and fifth metacarpal bones with mild volar angulation of the distal fracture fragments. Soft tissue swelling. Electronically Signed   By: Burman NievesWilliam  Stevens M.D.   On: 05/13/2017 23:24    Procedures Procedures (including critical care time)  Medications Ordered in ED Medications  acetaminophen (TYLENOL) tablet 1,000 mg (1,000 mg Oral Given 05/14/17 0053)     Initial Impression / Assessment and Plan / ED Course  I have reviewed the triage vital signs and the nursing notes.  Pertinent labs & imaging results that were available during my care of the patient were reviewed by me and considered in my medical decision making (see chart for details).     26 year old male who presents for evaluation of right hand pain that occurred this morning.  Patient reports that he was messing around with some friends when he punched a wall.  Has had some associated swelling to the area.  No medications for pain. Patient is afebrile, non-toxic appearing, sitting comfortably on examination table. Vital signs reviewed and stable.  Patient is slightly hypertensive.,  Likely secondary to pain.  Will give analgesics in the  department and assess. Patient is neurovascularly intact.  Initial x-rays ordered at triage.  Concern for fracture or dislocation.  Records reviewed. Patient had a fracture of the right 4th and 5th metacarpal in April 2018 that required surgical pinning.   X-rays reviewed.  There is a transverse fracture of right fourth and fifth metacarpal bones with mild volar angulation of the distal fracture fragments. These fractures appear to be acute and do not reflect previous injuries. Given findings on x-ray and small angulation, plan for splint in the department with outpatient hand follow-up.  Valuation of her splint placement.  Patient with good cap refill.  Sling provided for support and stabilization.  Instructed patient on follow-up with either referred orthopedic surgery or hand surgery that he has seen previously. Patient had ample opportunity for questions and discussion. All patient's questions were answered with full understanding. Strict return precautions discussed. Patient expresses understanding and agreement to plan.  Final Clinical Impressions(s) / ED Diagnoses   Final diagnoses:  Closed nondisplaced fracture of shaft of fourth metacarpal bone of right hand, initial encounter  Closed nondisplaced fracture of shaft of fifth metacarpal bone of right hand, initial encounter    ED Discharge Orders    None       Rosana Hoes 05/14/17 9147    Geoffery Lyons, MD 05/14/17 [phone]

## 2017-05-14 NOTE — Discharge Instructions (Signed)
You can take Tylenol or Ibuprofen as directed for pain. You can alternate Tylenol and Ibuprofen every 4 hours. If you take Tylenol at 1pm, then you can take Ibuprofen at 5pm. Then you can take Tylenol again at 9pm.   Wear the splint at all times for support and stabilization.    Follow-up with referred orthopedic doctor.  Call in the next 24-48 hours during patient.  Return to the emergency department for any worsening pain, numbness or weakness of worsening swelling, worsening redness that begins to spread up her arm or any other worsening or concerning symptoms.

## 2017-06-22 ENCOUNTER — Encounter (HOSPITAL_BASED_OUTPATIENT_CLINIC_OR_DEPARTMENT_OTHER): Payer: Self-pay | Admitting: *Deleted

## 2017-06-22 ENCOUNTER — Other Ambulatory Visit: Payer: Self-pay

## 2017-06-22 ENCOUNTER — Emergency Department (HOSPITAL_BASED_OUTPATIENT_CLINIC_OR_DEPARTMENT_OTHER)
Admission: EM | Admit: 2017-06-22 | Discharge: 2017-06-22 | Disposition: A | Discharge status: Home / Self Care | Payer: Self-pay | Attending: Emergency Medicine | Admitting: Emergency Medicine

## 2017-06-22 DIAGNOSIS — F17210 Nicotine dependence, cigarettes, uncomplicated: Secondary | ICD-10-CM | POA: Insufficient documentation

## 2017-06-22 DIAGNOSIS — Z79899 Other long term (current) drug therapy: Secondary | ICD-10-CM | POA: Insufficient documentation

## 2017-06-22 DIAGNOSIS — B360 Pityriasis versicolor: Secondary | ICD-10-CM | POA: Insufficient documentation

## 2017-06-22 DIAGNOSIS — I10 Essential (primary) hypertension: Secondary | ICD-10-CM | POA: Insufficient documentation

## 2017-06-22 MED ORDER — SELENIUM SULFIDE 2.5 % EX LOTN: 1.0000 "application " | TOPICAL_LOTION | Freq: Every day | CUTANEOUS | 12 refills | Status: AC

## 2017-06-22 NOTE — ED Triage Notes (Signed)
He has discoloration to his right inner elbow. He uses selenium sulfide but ran out of the medication.

## 2017-06-22 NOTE — Discharge Instructions (Signed)
Apply selenium sulfide to affected areas for 10 minutes daily for 1-4 weeks or 1 overnight application.  You do this until clear.  Please return to the emergency department if you develop any new or worsening symptoms.  I advised to follow-up and establish care with a primary care provider, as your blood pressure is elevated today and you should have it rechecked.

## 2017-06-22 NOTE — ED Provider Notes (Signed)
[facility] EMERGENCY DEPARTMENT Provider Note   CSN: [id number] Arrival date & time: 06/22/17  1231     History   Chief Complaint Chief Complaint  Patient presents with  . Rash    HPI Alec Henderson is a 27 y.o. male who presents with a few week history of rash to bilateral elbows and bilateral shoulders.  Patient reports he has had the rash before and was treated with selenium sulfide.  It is not itchy or painful.  He denies any other symptoms.  He does not have any selenium sulfide use at home right now.  HPI  Past Medical History:  Diagnosis Date  . Closed fracture of metacarpal base of finger 08/2016   right 4th and 5th  . Hypertension    no meds. in 6 mos., per pt.    There are no active problems to display for this patient.   Past Surgical History:  Procedure Laterality Date  . APPENDECTOMY    . CLOSED REDUCTION FINGER WITH PERCUTANEOUS PINNING Right 08/21/2016   Procedure: CLOSED REDUCTION AND PINNING, FOURTH AND FIFTH METACARPLES;  Surgeon: Mack Hookavid Thompson, MD;  Location: [facility];  Service: Orthopedics;  Laterality: Right;  GENERAL ANESTHESIA WITH PRE-OP BLOCK       Home Medications    Prior to Admission medications   Medication Sig Start Date End Date Taking? Authorizing Provider  acetaminophen (TYLENOL) 325 MG tablet Take 2 tablets (650 mg total) by mouth every 6 (six) hours as needed for mild pain or moderate pain. 08/21/16   Mack Hookhompson, David, MD  HYDROcodone-acetaminophen (NORCO/VICODIN) 5-325 MG tablet Take 1 tablet by mouth every 4 (four) hours as needed. 08/15/16   Rancour, Jeannett SeniorStephen, MD  ibuprofen (ADVIL) 200 MG tablet Take 3 tablets (600 mg total) by mouth every 6 (six) hours as needed for moderate pain. 08/21/16   Mack Hookhompson, David, MD  oxyCODONE (ROXICODONE) 5 MG immediate release tablet Take 1 tablet (5 mg total) by mouth every 6 (six) hours as needed for breakthrough pain. 08/21/16   Mack Hookhompson, David, MD  selenium sulfide (SELSUN) 2.5 %  shampoo Apply 1 application topically daily. 06/22/17   Emi HolesLaw, Amari Zagal M, PA-C    Family History No family history on file.  Social History Social History   Tobacco Use  . Smoking status: Current Every Day Smoker    Packs/day: 1.00    Years: 4.00    Pack years: 4.00    Types: Cigarettes  . Smokeless tobacco: Never Used  Substance Use Topics  . Alcohol use: No  . Drug use: No     Allergies   Patient has no known allergies.   Review of Systems Review of Systems  Constitutional: Negative for fever.  Skin: Positive for rash.     Physical Exam Updated Vital Signs BP (!) 143/96   Pulse 81   Temp 98.6 F (37 C) (Oral)   Resp 18   Ht 5\' 9"  (1.753 m)   Wt 90.7 kg (200 lb)   SpO2 100%   BMI 29.53 kg/m   Physical Exam  Constitutional: He appears well-developed and well-nourished. No distress.  HENT:  Head: Normocephalic and atraumatic.  Eyes: Conjunctivae are normal. Right eye exhibits no discharge. Left eye exhibits no discharge. No scleral icterus.  Neck: Normal range of motion. No thyromegaly present.  Cardiovascular: Normal rate, regular rhythm, normal heart sounds and intact distal pulses. Exam reveals no gallop and no friction rub.  No murmur heard. Pulmonary/Chest: Effort normal and breath sounds  normal. No stridor. No respiratory distress. He has no wheezes. He has no rales.  Musculoskeletal: He exhibits no edema.  Neurological: He is alert. Coordination normal.  Skin: Skin is warm and dry. No rash noted. He is not diaphoretic. No pallor.  Discoloration flexural surfaces of elbows as well as bilateral shoulders - see image  Psychiatric: He has a normal mood and affect.  Nursing note and vitals reviewed.      ED Treatments / Results  Labs (all labs ordered are listed, but only abnormal results are displayed) Labs Reviewed - No data to display  EKG  EKG Interpretation None       Radiology No results found.  Procedures Procedures (including  critical care time)  Medications Ordered in ED Medications - No data to display   Initial Impression / Assessment and Plan / ED Course  I have reviewed the triage vital signs and the nursing notes.  Pertinent labs & imaging results that were available during my care of the patient were reviewed by me and considered in my medical decision making (see chart for details).     Patient with tinea versicolor.  Will treat with selenium sulfide.  Patient blood pressure elevated on arrival, however decreased to 143/96 throughout ED course without intervention.  Will have patient follow-up and establish care with PCP for treatment.  Patient states he used to be on hypertension medication 2 years ago, however he has not sure which. Return precautions discussed.  Patient understands and agrees with plan.  Patient discharged in satisfactory condition.  Final Clinical Impressions(s) / ED Diagnoses   Final diagnoses:  Tinea versicolor    ED Discharge Orders        Ordered    selenium sulfide (SELSUN) 2.5 % shampoo  Daily     06/22/17 [address] PA-C 06/22/17 1604    Benjiman Core, MD 06/23/17 0025

## 2019-05-05 ENCOUNTER — Emergency Department (HOSPITAL_BASED_OUTPATIENT_CLINIC_OR_DEPARTMENT_OTHER)
Admission: EM | Admit: 2019-05-05 | Discharge: 2019-05-05 | Disposition: A | Discharge status: Home / Self Care | Payer: Self-pay | Attending: Emergency Medicine | Admitting: Emergency Medicine

## 2019-05-05 ENCOUNTER — Other Ambulatory Visit: Payer: Self-pay

## 2019-05-05 ENCOUNTER — Encounter (HOSPITAL_BASED_OUTPATIENT_CLINIC_OR_DEPARTMENT_OTHER): Payer: Self-pay | Admitting: *Deleted

## 2019-05-05 DIAGNOSIS — Z79899 Other long term (current) drug therapy: Secondary | ICD-10-CM | POA: Insufficient documentation

## 2019-05-05 DIAGNOSIS — F17210 Nicotine dependence, cigarettes, uncomplicated: Secondary | ICD-10-CM | POA: Insufficient documentation

## 2019-05-05 DIAGNOSIS — H00025 Hordeolum internum left lower eyelid: Secondary | ICD-10-CM | POA: Insufficient documentation

## 2019-05-05 DIAGNOSIS — I10 Essential (primary) hypertension: Secondary | ICD-10-CM | POA: Insufficient documentation

## 2019-05-05 NOTE — Discharge Instructions (Signed)
Use a warm compress, 20 minutes at a time, 3 times a day. Otherwise try not to touch or irritate your eye. You may use lubricating eyedrops as needed for eye irritation. Follow-up with the eye doctor listed below if your symptoms not improving. Return to the emergency room if you develop vision loss, inability to open your eye, severe worsening pain, any new, worsening, or concerning symptoms.

## 2019-05-05 NOTE — ED Triage Notes (Addendum)
Eye irritation onset yesterday some redness No known inj

## 2019-05-05 NOTE — ED Provider Notes (Signed)
[facility] EMERGENCY DEPARTMENT Provider Note   CSN: [id number] Arrival date & time: 05/05/19  1548     History Chief Complaint  Patient presents with  . Eye Pain    Alec Henderson is a 28 y.o. male presenting for evaluation of left eye irritation.  Patient states yesterday he noticed there is something irritating in the bottom lid of his left eye.  Today he feels his eye is a little bit more swollen.  He denies eye pain.  He denies vision loss.  No photophobia.  No pain with movement of his eyes.  He denies trauma or injury.  He bought an over-the-counter stye ointment which he used without significant change.  No symptoms of the right eye.  He does not wear contacts or glasses.  He has no other medical problems, takes no medications daily.  HPI     Past Medical History:  Diagnosis Date  . Closed fracture of metacarpal base of finger 08/2016   right 4th and 5th  . Hypertension    no meds. in 6 mos., per pt.    There are no problems to display for this patient.   Past Surgical History:  Procedure Laterality Date  . APPENDECTOMY    . CLOSED REDUCTION FINGER WITH PERCUTANEOUS PINNING Right 08/21/2016   Procedure: CLOSED REDUCTION AND PINNING, FOURTH AND FIFTH METACARPLES;  Surgeon: Mack Hook, MD;  Location: [facility];  Service: Orthopedics;  Laterality: Right;  GENERAL ANESTHESIA WITH PRE-OP BLOCK       No family history on file.  Social History   Tobacco Use  . Smoking status: Current Every Day Smoker    Packs/day: 1.00    Years: 4.00    Pack years: 4.00    Types: Cigarettes  . Smokeless tobacco: Never Used  Substance Use Topics  . Alcohol use: No  . Drug use: No    Home Medications Prior to Admission medications   Medication Sig Start Date End Date Taking? Authorizing Provider  acetaminophen (TYLENOL) 325 MG tablet Take 2 tablets (650 mg total) by mouth every 6 (six) hours as needed for mild pain or moderate pain. 08/21/16    Mack Hook, MD  HYDROcodone-acetaminophen (NORCO/VICODIN) 5-325 MG tablet Take 1 tablet by mouth every 4 (four) hours as needed. 08/15/16   Rancour, Jeannett Senior, MD  ibuprofen (ADVIL) 200 MG tablet Take 3 tablets (600 mg total) by mouth every 6 (six) hours as needed for moderate pain. 08/21/16   Mack Hook, MD  oxyCODONE (ROXICODONE) 5 MG immediate release tablet Take 1 tablet (5 mg total) by mouth every 6 (six) hours as needed for breakthrough pain. 08/21/16   Mack Hook, MD  selenium sulfide (SELSUN) 2.5 % shampoo Apply 1 application topically daily. 06/22/17   Emi Holes, PA-C    Allergies    Patient has no known allergies.  Review of Systems   Review of Systems  Constitutional: Negative for fever.  Eyes: Negative for photophobia, pain, discharge and visual disturbance.       L eye irritation    Physical Exam Updated Vital Signs BP (!) 162/101 (BP Location: Right Arm)   Temp 99 F (37.2 C) (Oral)   Resp 16   Ht 5\' 10"  (1.778 m)   Wt 106.6 kg   SpO2 100%   BMI 33.72 kg/m   Physical Exam Vitals and nursing note reviewed.  Constitutional:      General: He is not in acute distress.    Appearance: He  is well-developed.  HENT:     Head: Normocephalic and atraumatic.  Eyes:     Extraocular Movements: Extraocular movements intact.     Conjunctiva/sclera: Conjunctivae normal.     Pupils: Pupils are equal, round, and reactive to light.     Comments: See picture below.  Raised area of the inner lower lid on the left eye consistent with stye.  Mild lower lid swelling.  EOMI and PERRLA without pain.  Pulmonary:     Effort: Pulmonary effort is normal.  Abdominal:     General: There is no distension.  Musculoskeletal:        General: Normal range of motion.     Cervical back: Normal range of motion.  Skin:    General: Skin is warm.     Findings: No rash.  Neurological:     Mental Status: He is alert and oriented to person, place, and time.         ED Results /  Procedures / Treatments   Labs (all labs ordered are listed, but only abnormal results are displayed) Labs Reviewed - No data to display  EKG None  Radiology No results found.  Procedures Procedures (including critical care time)  Medications Ordered in ED Medications - No data to display  ED Course  I have reviewed the triage vital signs and the nursing notes.  Pertinent labs & imaging results that were available during my care of the patient were reviewed by me and considered in my medical decision making (see chart for details).    MDM Rules/Calculators/A&P                      Patient presenting for evaluation of left eye irritation.  Physical exam consistent with internal stye.  Doubt corneal abrasion or ulceration.  No photophobia or difficulty with extraocular movements.  No vision changes.  Instructed patient to treat with warm compresses and follow-up with ophthalmology if symptoms not improving.  At this time, patient appears safe for discharge.  Return precautions given.  Patient states he understands and agrees to plan.  Final Clinical Impression(s) / ED Diagnoses Final diagnoses:  Hordeolum internum of left lower eyelid    Rx / DC Orders ED Discharge Orders    None       Franchot Heidelberg, PA-C 05/05/19 1748    Fredia Sorrow, MD 05/08/19 [phone]

## 2019-06-13 ENCOUNTER — Emergency Department (HOSPITAL_BASED_OUTPATIENT_CLINIC_OR_DEPARTMENT_OTHER)
Admission: EM | Admit: 2019-06-13 | Discharge: 2019-06-13 | Disposition: A | Discharge status: Home / Self Care | Payer: Self-pay | Attending: Emergency Medicine | Admitting: Emergency Medicine

## 2019-06-13 ENCOUNTER — Encounter (HOSPITAL_BASED_OUTPATIENT_CLINIC_OR_DEPARTMENT_OTHER): Payer: Self-pay

## 2019-06-13 ENCOUNTER — Other Ambulatory Visit: Payer: Self-pay

## 2019-06-13 DIAGNOSIS — Z79899 Other long term (current) drug therapy: Secondary | ICD-10-CM | POA: Insufficient documentation

## 2019-06-13 DIAGNOSIS — I10 Essential (primary) hypertension: Secondary | ICD-10-CM | POA: Insufficient documentation

## 2019-06-13 DIAGNOSIS — F17210 Nicotine dependence, cigarettes, uncomplicated: Secondary | ICD-10-CM | POA: Insufficient documentation

## 2019-06-13 LAB — CBC WITH DIFFERENTIAL/PLATELET
Abs Immature Granulocytes: 0.02 10*3/uL (ref 0.00–0.07)
Basophils Absolute: 0 10*3/uL (ref 0.0–0.1)
Basophils Relative: 0 %
Eosinophils Absolute: 0 10*3/uL (ref 0.0–0.5)
Eosinophils Relative: 0 %
HCT: 47.4 % (ref 39.0–52.0)
Hemoglobin: 15.6 g/dL (ref 13.0–17.0)
Immature Granulocytes: 0 %
Lymphocytes Relative: 23 %
Lymphs Abs: 2.2 10*3/uL (ref 0.7–4.0)
MCH: 28 pg (ref 26.0–34.0)
MCHC: 32.9 g/dL (ref 30.0–36.0)
MCV: 85.1 fL (ref 80.0–100.0)
Monocytes Absolute: 0.7 10*3/uL (ref 0.1–1.0)
Monocytes Relative: 8 %
Neutro Abs: 6.4 10*3/uL (ref 1.7–7.7)
Neutrophils Relative %: 69 %
Platelets: 411 10*3/uL — ABNORMAL HIGH (ref 150–400)
RBC: 5.57 MIL/uL (ref 4.22–5.81)
RDW: 12.6 % (ref 11.5–15.5)
WBC: 9.4 10*3/uL (ref 4.0–10.5)
nRBC: 0 % (ref 0.0–0.2)

## 2019-06-13 LAB — BASIC METABOLIC PANEL
Anion gap: 9 (ref 5–15)
BUN: 12 mg/dL (ref 6–20)
CO2: 24 mmol/L (ref 22–32)
Calcium: 9.7 mg/dL (ref 8.9–10.3)
Chloride: 103 mmol/L (ref 98–111)
Creatinine, Ser: 1.16 mg/dL (ref 0.61–1.24)
GFR calc Af Amer: >60 mL/min (ref >60)
GFR calc non Af Amer: >60 mL/min (ref >60)
Glucose, Bld: 113 mg/dL — ABNORMAL HIGH (ref 70–99)
Potassium: 3.3 mmol/L — ABNORMAL LOW (ref 3.5–5.1)
Sodium: 136 mmol/L (ref 135–145)

## 2019-06-13 MED ORDER — AMLODIPINE BESYLATE 5 MG PO TABS: 5.0000 mg | ORAL_TABLET | Freq: Every day | ORAL | 0 refills | Status: AC

## 2019-06-13 MED FILL — AMLODIPINE BESYLATE 5 MG TA: 5 | 30 days supply | Qty: 30 | Fill #0

## 2019-06-13 NOTE — ED Provider Notes (Addendum)
[facility] EMERGENCY DEPARTMENT Provider Note   CSN: [id number] Arrival date & time: 06/13/19  1408     History Chief Complaint  Patient presents with  . Hypertension    Alec Henderson is a 29 y.o. male.  29 yo M with a chief complaints of hypertension.  Patient states that he felt a little funny today and so he checked his blood pressure at his father's house.  Found to be hypertensive 180/120.  He denies any other specific symptoms.  Denies headache denies blurry vision denies chest pain shortness of breath denies abdominal pain.  Denies pain of any sort.  Denies unilateral numbness or weakness denies difficulty with speech or swallowing.  Take half of his father's blood pressure medicine with some improvement but decided to come to the ED for evaluation.  Patient was diagnosed with hypertension while he was seen by a physician when he was incarcerated.  Do not have any ongoing prescriptions.  Has not followed up with a doctor as an outpatient.  The history is provided by the patient.  Hypertension This is a recurrent problem. The current episode started 12 to 24 hours ago. The problem occurs constantly. The problem has not changed since onset.Pertinent negatives include no chest pain, no abdominal pain, no headaches and no shortness of breath. Nothing aggravates the symptoms. Nothing relieves the symptoms. He has tried nothing for the symptoms. The treatment provided no relief.       Past Medical History:  Diagnosis Date  . Closed fracture of metacarpal base of finger 08/2016   right 4th and 5th  . Hypertension    no meds. in 6 mos., per pt.    There are no problems to display for this patient.   Past Surgical History:  Procedure Laterality Date  . APPENDECTOMY    . CLOSED REDUCTION FINGER WITH PERCUTANEOUS PINNING Right 08/21/2016   Procedure: CLOSED REDUCTION AND PINNING, FOURTH AND FIFTH METACARPLES;  Surgeon: Milly Jakob, MD;  Location: [facility];  Service: Orthopedics;  Laterality: Right;  GENERAL ANESTHESIA WITH PRE-OP BLOCK       No family history on file.  Social History   Tobacco Use  . Smoking status: Current Every Day Smoker    Packs/day: 1.00    Years: 4.00    Pack years: 4.00    Types: Cigarettes  . Smokeless tobacco: Never Used  Substance Use Topics  . Alcohol use: Yes    Comment: occ  . Drug use: No    Home Medications Prior to Admission medications   Medication Sig Start Date End Date Taking? Authorizing Provider  acetaminophen (TYLENOL) 325 MG tablet Take 2 tablets (650 mg total) by mouth every 6 (six) hours as needed for mild pain or moderate pain. 08/21/16   Milly Jakob, MD  amLODipine (NORVASC) 5 MG tablet Take 1 tablet (5 mg total) by mouth daily. 06/13/19   Deno Etienne, DO  HYDROcodone-acetaminophen (NORCO/VICODIN) 5-325 MG tablet Take 1 tablet by mouth every 4 (four) hours as needed. 08/15/16   Rancour, Annie Main, MD  ibuprofen (ADVIL) 200 MG tablet Take 3 tablets (600 mg total) by mouth every 6 (six) hours as needed for moderate pain. 08/21/16   Milly Jakob, MD  oxyCODONE (ROXICODONE) 5 MG immediate release tablet Take 1 tablet (5 mg total) by mouth every 6 (six) hours as needed for breakthrough pain. 08/21/16   Milly Jakob, MD  selenium sulfide (SELSUN) 2.5 % shampoo Apply 1 application topically daily. 06/22/17  Emi Holes, PA-C    Allergies    Patient has no known allergies.  Review of Systems   Review of Systems  Constitutional: Negative for chills and fever.  HENT: Negative for congestion and facial swelling.   Eyes: Negative for discharge and visual disturbance.  Respiratory: Negative for shortness of breath.   Cardiovascular: Negative for chest pain and palpitations.  Gastrointestinal: Negative for abdominal pain, diarrhea and vomiting.  Musculoskeletal: Negative for arthralgias and myalgias.  Skin: Negative for color change and rash.  Neurological: Negative for tremors,  syncope and headaches.  Psychiatric/Behavioral: Negative for confusion and dysphoric mood.    Physical Exam Updated Vital Signs BP (!) 186/127   Pulse (!) 115   Temp 99.4 F (37.4 C) (Oral)   Resp 20   Ht 5\' 10"  (1.778 m)   Wt 101.6 kg   SpO2 100%   BMI 32.14 kg/m   Physical Exam Vitals and nursing note reviewed.  Constitutional:      Appearance: He is well-developed.  HENT:     Head: Normocephalic and atraumatic.  Eyes:     Pupils: Pupils are equal, round, and reactive to light.  Neck:     Vascular: No JVD.  Cardiovascular:     Rate and Rhythm: Regular rhythm. Tachycardia present.     Heart sounds: No murmur. No friction rub. No gallop.   Pulmonary:     Effort: No respiratory distress.     Breath sounds: No wheezing.  Abdominal:     General: There is no distension.     Tenderness: There is no guarding or rebound.  Musculoskeletal:        General: Normal range of motion.     Cervical back: Normal range of motion and neck supple.  Skin:    Coloration: Skin is not pale.     Findings: No rash.  Neurological:     Mental Status: He is alert and oriented to person, place, and time.     GCS: GCS eye subscore is 4. GCS verbal subscore is 5. GCS motor subscore is 6.     Cranial Nerves: Cranial nerves are intact.     Sensory: Sensation is intact.     Motor: Motor function is intact.     Coordination: Coordination is intact.     Gait: Gait is intact.     Comments: Benign neurologic exam  Psychiatric:        Behavior: Behavior normal.     ED Results / Procedures / Treatments   Labs (all labs ordered are listed, but only abnormal results are displayed) Labs Reviewed  CBC WITH DIFFERENTIAL/PLATELET - Abnormal; Notable for the following components:      Result Value   Platelets 411 (*)    All other components within normal limits  BASIC METABOLIC PANEL - Abnormal; Notable for the following components:   Potassium 3.3 (*)    Glucose, Bld 113 (*)    All other  components within normal limits    EKG EKG Interpretation  Date/Time:  Friday June 13 2019 15:36:03 EST Ventricular Rate:  101 PR Interval:    QRS Duration: 85 QT Interval:  335 QTC Calculation: 435 R Axis:   75 Text Interpretation: Sinus tachycardia Prominent P waves, nondiagnostic ST elev, probable normal early repol pattern No old tracing to compare Confirmed by [id number]) on 06/13/2019 3:47:15 PM   Radiology No results found.  Procedures Procedures (including critical care time)  Medications Ordered in ED Medications -  No data to display  ED Course  I have reviewed the triage vital signs and the nursing notes.  Pertinent labs & imaging results that were available during my care of the patient were reviewed by me and considered in my medical decision making (see chart for details).    MDM Rules/Calculators/A&P                      29 yo M with a chief complaint of asymptomatic hypertension.  He is well-appearing and nontoxic.  Has benign neurologic exam.  He is tachycardic into the 115 range on arrival.  Had improved somewhat at rest but still tachycardic.  Patient has no recent labs in the system will obtain a basic laboratory evaluation to assess for renal dysfunction or electrolyte abnormality.  He denies any stimulant use.  Has been taking quite a bit of DayQuil and NyQuil to help him with some cough symptoms. Recent covid test negative.   Patient's tach already has resolved spontaneously.  On reassessment his heart rate is in the low 90s.  Lab work is returned without significant anemia, no electrolyte abnormality.  No renal dysfunction.  Will start on amlodipine.  PCP follow-up.  4:24 PM:  I have discussed the diagnosis/risks/treatment options with the patient and believe the pt to be eligible for discharge home to follow-up with PCP. We also discussed returning to the ED immediately if new or worsening sx occur. We discussed the sx which are most concerning  (e.g., sudden worsening pain, fever, inability to tolerate by mouth) that necessitate immediate return. Medications administered to the patient during their visit and any new prescriptions provided to the patient are listed below.  Medications given during this visit Medications - No data to display   The patient appears reasonably screen and/or stabilized for discharge and I doubt any other medical condition or other [facility] requiring further screening, evaluation, or treatment in the ED at this time prior to discharge.    Final Clinical Impression(s) / ED Diagnoses Final diagnoses:  Essential hypertension    Rx / DC Orders ED Discharge Orders         Ordered    amLODipine (NORVASC) 5 MG tablet  Daily     06/13/19 1620           Melene Plan, DO 06/13/19 1624    Melene Plan, DO 06/13/19 1624

## 2019-06-13 NOTE — Discharge Instructions (Signed)
Please follow-up with a family physician.  Unfortunately cough and cold medications can raise your blood pressure.  There is a certain brand that people can take with high blood pressure.  Otherwise try to avoid most of the other medicines as they may actually raise her blood pressure.  Take your medication as prescribed.  Return for chest pain shortness of breath difficulty talking swallowing or moving your arms or legs.

## 2019-06-13 NOTE — ED Triage Notes (Signed)
Pt c/o elevated BP x today-denies pain-hx of noncompliant HTN-NAD-steady gait

## 2019-06-13 NOTE — ED Notes (Signed)
PT changed into gown and placed on pulse oximeter and blood pressure cuff

## 2021-08-05 ENCOUNTER — Other Ambulatory Visit: Payer: Self-pay

## 2021-08-05 ENCOUNTER — Other Ambulatory Visit (HOSPITAL_BASED_OUTPATIENT_CLINIC_OR_DEPARTMENT_OTHER): Payer: Self-pay

## 2021-08-05 ENCOUNTER — Emergency Department (HOSPITAL_BASED_OUTPATIENT_CLINIC_OR_DEPARTMENT_OTHER): Payer: BLUE CROSS/BLUE SHIELD

## 2021-08-05 ENCOUNTER — Encounter (HOSPITAL_BASED_OUTPATIENT_CLINIC_OR_DEPARTMENT_OTHER): Payer: Self-pay | Admitting: Emergency Medicine

## 2021-08-05 ENCOUNTER — Emergency Department (HOSPITAL_BASED_OUTPATIENT_CLINIC_OR_DEPARTMENT_OTHER)
Admission: EM | Admit: 2021-08-05 | Discharge: 2021-08-05 | Disposition: A | Discharge status: Home / Self Care | Payer: BLUE CROSS/BLUE SHIELD | Attending: Emergency Medicine | Admitting: Emergency Medicine

## 2021-08-05 DIAGNOSIS — F17200 Nicotine dependence, unspecified, uncomplicated: Secondary | ICD-10-CM | POA: Diagnosis not present

## 2021-08-05 DIAGNOSIS — S60944A Unspecified superficial injury of right ring finger, initial encounter: Secondary | ICD-10-CM | POA: Diagnosis present

## 2021-08-05 DIAGNOSIS — I2510 Atherosclerotic heart disease of native coronary artery without angina pectoris: Secondary | ICD-10-CM | POA: Diagnosis not present

## 2021-08-05 DIAGNOSIS — I10 Essential (primary) hypertension: Secondary | ICD-10-CM | POA: Insufficient documentation

## 2021-08-05 DIAGNOSIS — Z79899 Other long term (current) drug therapy: Secondary | ICD-10-CM | POA: Insufficient documentation

## 2021-08-05 DIAGNOSIS — S62354A Nondisplaced fracture of shaft of fourth metacarpal bone, right hand, initial encounter for closed fracture: Secondary | ICD-10-CM | POA: Insufficient documentation

## 2021-08-05 HISTORY — DX: Atherosclerotic heart disease of native coronary artery without angina pectoris: I25.10

## 2021-08-05 MED ORDER — ACETAMINOPHEN 325 MG PO TABS
650.0000 mg | ORAL_TABLET | Freq: Four times a day (QID) | ORAL | 0 refills | Status: AC | PRN
  Filled 2021-08-05: qty 50, 7d supply, fill #0

## 2021-08-05 MED ORDER — OXYCODONE HCL 5 MG PO TABS
5.0000 mg | ORAL_TABLET | ORAL | 0 refills | Status: AC | PRN
  Filled 2021-08-05: qty 15, 3d supply, fill #0

## 2021-08-05 NOTE — Discharge Instructions (Signed)
It was a pleasure caring for you today in the emergency department. ° °Please return to the emergency department for any worsening or worrisome symptoms. ° ° °
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
## 2021-10-03 ENCOUNTER — Encounter (HOSPITAL_BASED_OUTPATIENT_CLINIC_OR_DEPARTMENT_OTHER): Payer: Self-pay | Admitting: Emergency Medicine

## 2021-10-03 ENCOUNTER — Emergency Department (HOSPITAL_BASED_OUTPATIENT_CLINIC_OR_DEPARTMENT_OTHER): Payer: Self-pay

## 2021-10-03 ENCOUNTER — Emergency Department (HOSPITAL_BASED_OUTPATIENT_CLINIC_OR_DEPARTMENT_OTHER)
Admission: EM | Admit: 2021-10-03 | Discharge: 2021-10-03 | Disposition: A | Discharge status: Home / Self Care | Payer: Self-pay | Attending: Emergency Medicine | Admitting: Emergency Medicine

## 2021-10-03 ENCOUNTER — Other Ambulatory Visit: Payer: Self-pay

## 2021-10-03 DIAGNOSIS — Y9367 Activity, basketball: Secondary | ICD-10-CM | POA: Insufficient documentation

## 2021-10-03 DIAGNOSIS — S8991XA Unspecified injury of right lower leg, initial encounter: Secondary | ICD-10-CM | POA: Insufficient documentation

## 2021-10-03 DIAGNOSIS — W2105XA Struck by basketball, initial encounter: Secondary | ICD-10-CM | POA: Insufficient documentation

## 2021-10-03 MED ORDER — IBUPROFEN 800 MG PO TABS
800.0000 mg | ORAL_TABLET | Freq: Once | ORAL | Status: AC
  Administered 2021-10-03: 800 mg via ORAL
  Filled 2021-10-03: qty 1

## 2021-10-03 NOTE — ED Notes (Signed)
Patient transported to X-ray 

## 2021-10-03 NOTE — ED Notes (Signed)
Pt discharged to home. Discharge instructions have been discussed with patient and/or family members. Pt verbally acknowledges understanding d/c instructions, and endorses comprehension to checkout at registration before leaving.  °

## 2021-10-03 NOTE — ED Triage Notes (Signed)
Pt arrives pov, slow limp to triage, c/o right knee injury while playing basketball. Pt reports jumping and hyperextended knee when landing.

## 2021-10-03 NOTE — ED Provider Notes (Signed)
[facility] EMERGENCY DEPARTMENT Provider Note   CSN: [id number] Arrival date & time: 10/03/21  0001     History  Chief Complaint  Patient presents with   Knee Injury    Alec Henderson is a 31 y.o. male.  The history is provided by the patient.  Alec Henderson is a 31 y.o. male who presents to the Emergency Department complaining of knee injury.  He was playing basketball and jumped to block a ball and when he landed his right knee was hyperextended.  He immediately fell to the ground and states he was on the ground for about 15 to 20 minutes before he was able to get back up.  He did have pain that went all the way down his leg when the injury occurred.  He is now able to bear weight but has pain on weightbearing and with fully extending the leg.  Pain is predominantly over the anterior knee as well as the lateral knee.  No additional injuries.  He has no known medical problems takes no medications.    Home Medications Prior to Admission medications   Medication Sig Start Date End Date Taking? Authorizing Provider  acetaminophen (TYLENOL) 325 MG tablet Take 2 tablets (650 mg total) by mouth every 6 (six) hours as needed for mild pain or moderate pain. 08/21/16   Mack Hook, MD  acetaminophen (TYLENOL) 325 MG tablet Take 2 tablets (650 mg total) by mouth every 6 (six) hours as needed. 08/05/21   Sloan Leiter, DO  amLODipine (NORVASC) 5 MG tablet Take 1 tablet (5 mg total) by mouth daily. 06/13/19   Melene Plan, DO  HYDROcodone-acetaminophen (NORCO/VICODIN) 5-325 MG tablet Take 1 tablet by mouth every 4 (four) hours as needed. 08/15/16   Rancour, Jeannett Senior, MD  ibuprofen (ADVIL) 200 MG tablet Take 3 tablets (600 mg total) by mouth every 6 (six) hours as needed for moderate pain. 08/21/16   Mack Hook, MD  oxyCODONE (ROXICODONE) 5 MG immediate release tablet Take 1 tablet (5 mg total) by mouth every 6 (six) hours as needed for breakthrough pain. 08/21/16   Mack Hook, MD  selenium  sulfide (SELSUN) 2.5 % shampoo Apply 1 application topically daily. 06/22/17   Emi Holes, PA-C      Allergies    Patient has no known allergies.    Review of Systems   Review of Systems  All other systems reviewed and are negative.  Physical Exam Updated Vital Signs BP (!) 151/101 (BP Location: Left Arm)   Pulse 95   Temp 98.6 F (37 C) (Oral)   Resp 18   Ht 5\' 9"  (1.753 m)   Wt 106.6 kg   SpO2 99%   BMI 34.70 kg/m  Physical Exam Vitals and nursing note reviewed.  Constitutional:      Appearance: He is well-developed.  HENT:     Head: Normocephalic and atraumatic.  Cardiovascular:     Rate and Rhythm: Normal rate and regular rhythm.  Pulmonary:     Effort: Pulmonary effort is normal. No respiratory distress.  Musculoskeletal:     Comments: 2+ DP pulses bilaterally.  There is no significant soft tissue swelling to the right knee.  There is tenderness to palpation over the right lateral joint line, right anterior knee.  He is able to flex and extend the knee, dorsiflex plantarflex at the ankle.  He has an antalgic gait.  Sensation to light touch intact in bilateral lower extremities  Skin:    General:  Skin is warm and dry.  Neurological:     Mental Status: He is alert and oriented to person, place, and time.  Psychiatric:        Behavior: Behavior normal.    ED Results / Procedures / Treatments   Labs (all labs ordered are listed, but only abnormal results are displayed) Labs Reviewed - No data to display  EKG None  Radiology DG Knee Complete 4 Views Right  Result Date: 10/03/2021 CLINICAL DATA:  Trauma to the right knee. EXAM: RIGHT KNEE - COMPLETE 4+ VIEW COMPARISON:  None Available. FINDINGS: No evidence of fracture, dislocation, or joint effusion. No evidence of arthropathy or other focal bone abnormality. Soft tissues are unremarkable. IMPRESSION: Negative. Electronically Signed   By: Elgie Collard M.D.   On: 10/03/2021 00:37    Procedures Procedures     Medications Ordered in ED Medications  ibuprofen (ADVIL) tablet 800 mg (has no administration in time range)    ED Course/ Medical Decision Making/ A&P                           Medical Decision Making Amount and/or Complexity of Data Reviewed Radiology: ordered.  Risk Prescription drug management.   Patient here for evaluation of right knee pain around 8 PM after jumping and hyperextending the knee.  He is neurologically and vascularly intact distally.  Images are negative for acute fracture, personally interpreted images.  Current clinical picture is not consistent with spontaneously reduced dislocation.  Discussed with patient knee sprain, cannot rule out ligamentous injury.  Will place in sleeve with crutches for weightbearing as tolerated, orthopedic follow-up as needed.  Return precautions discussed.        Final Clinical Impression(s) / ED Diagnoses Final diagnoses:  Injury of right knee, initial encounter    Rx / DC Orders ED Discharge Orders     None         Tilden Fossa, MD 10/03/21 0130

## 2021-10-03 NOTE — ED Notes (Signed)
ED Provider at bedside. 

## 2022-03-22 ENCOUNTER — Emergency Department (HOSPITAL_BASED_OUTPATIENT_CLINIC_OR_DEPARTMENT_OTHER)
Admission: EM | Admit: 2022-03-22 | Discharge: 2022-03-22 | Disposition: A | Discharge status: Home / Self Care | Payer: Self-pay | Attending: Emergency Medicine | Admitting: Emergency Medicine

## 2022-03-22 ENCOUNTER — Encounter (HOSPITAL_BASED_OUTPATIENT_CLINIC_OR_DEPARTMENT_OTHER): Payer: Self-pay | Admitting: Pediatrics

## 2022-03-22 ENCOUNTER — Other Ambulatory Visit: Payer: Self-pay

## 2022-03-22 DIAGNOSIS — Z202 Contact with and (suspected) exposure to infections with a predominantly sexual mode of transmission: Secondary | ICD-10-CM | POA: Insufficient documentation

## 2022-03-22 DIAGNOSIS — Z711 Person with feared health complaint in whom no diagnosis is made: Secondary | ICD-10-CM

## 2022-03-22 LAB — URINALYSIS, ROUTINE W REFLEX MICROSCOPIC
Bilirubin Urine: NEGATIVE
Glucose, UA: NEGATIVE mg/dL
Ketones, ur: NEGATIVE mg/dL
Leukocytes,Ua: NEGATIVE
Nitrite: NEGATIVE
Protein, ur: NEGATIVE mg/dL
Specific Gravity, Urine: >=1.03 (ref 1.005–1.030)
pH: 6 (ref 5.0–8.0)

## 2022-03-22 LAB — URINALYSIS, MICROSCOPIC (REFLEX): WBC, UA: NONE SEEN WBC/hpf (ref 0–5)

## 2022-03-22 MED ORDER — DOXYCYCLINE HYCLATE 100 MG PO TABS
100.0000 mg | ORAL_TABLET | Freq: Once | ORAL | Status: AC
  Administered 2022-03-22: 100 mg via ORAL
  Filled 2022-03-22: qty 1

## 2022-03-22 MED ORDER — DOXYCYCLINE HYCLATE 100 MG PO CAPS: 100.0000 mg | ORAL_CAPSULE | Freq: Two times a day (BID) | ORAL | 0 refills | Status: AC

## 2022-03-22 MED ORDER — CEFTRIAXONE SODIUM 500 MG IJ SOLR
500.0000 mg | Freq: Once | INTRAMUSCULAR | Status: AC
  Administered 2022-03-22: 500 mg via INTRAMUSCULAR
  Filled 2022-03-22: qty 500

## 2022-03-22 NOTE — Discharge Instructions (Signed)
As we discussed, I tested you for gonorrhea and chlamydia and that will take about 1 to 2 days to come back and will show up on your MyChart.  You are given Rocephin which will treat gonorrhea.  If your chlamydia is negative you may stop taking the doxycycline  Please use condoms   See your doctor for follow-up  Return to ER if you have penile discharge or trouble urinating
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
## 2022-03-22 NOTE — ED Provider Notes (Signed)
[facility] EMERGENCY DEPARTMENT Provider Note   CSN: [id number] Arrival date & time: 03/22/22  1716     History  Chief Complaint  Patient presents with   Exposure to STD    Alec Henderson is a 31 y.o. male here presenting with possible STD exposure.  Patient had unprotected sex with a male 3 days ago.  She called him today and told him that she tested positive for STD but did not tell him what she has.  Patient denies any dysuria or penile discharge.  He states that he wants to be tested for STD and empirically treated.  The history is provided by the patient.       Home Medications Prior to Admission medications   Medication Sig Start Date End Date Taking? Authorizing Provider  acetaminophen (TYLENOL) 325 MG tablet Take 2 tablets (650 mg total) by mouth every 6 (six) hours as needed for mild pain or moderate pain. 08/21/16   Mack Hook, MD  acetaminophen (TYLENOL) 325 MG tablet Take 2 tablets (650 mg total) by mouth every 6 (six) hours as needed. 08/05/21   Sloan Leiter, DO  amLODipine (NORVASC) 5 MG tablet Take 1 tablet (5 mg total) by mouth daily. 06/13/19   Melene Plan, DO  HYDROcodone-acetaminophen (NORCO/VICODIN) 5-325 MG tablet Take 1 tablet by mouth every 4 (four) hours as needed. 08/15/16   Rancour, Jeannett Senior, MD  ibuprofen (ADVIL) 200 MG tablet Take 3 tablets (600 mg total) by mouth every 6 (six) hours as needed for moderate pain. 08/21/16   Mack Hook, MD  oxyCODONE (ROXICODONE) 5 MG immediate release tablet Take 1 tablet (5 mg total) by mouth every 6 (six) hours as needed for breakthrough pain. 08/21/16   Mack Hook, MD  selenium sulfide (SELSUN) 2.5 % shampoo Apply 1 application topically daily. 06/22/17   Emi Holes, PA-C      Allergies    Patient has no known allergies.    Review of Systems   Review of Systems  Genitourinary:  Negative for penile discharge.  All other systems reviewed and are negative.   Physical Exam Updated Vital  Signs BP (!) 142/95 (BP Location: Right Arm)   Pulse 90   Temp 98.4 F (36.9 C) (Oral)   Resp 17   Ht 5\' 10"  (1.778 m)   Wt 106.6 kg   SpO2 99%   BMI 33.72 kg/m  Physical Exam Vitals and nursing note reviewed.  HENT:     Head: Normocephalic.     Nose: Nose normal.     Mouth/Throat:     Mouth: Mucous membranes are moist.     Pharynx: Oropharynx is clear.  Eyes:     Extraocular Movements: Extraocular movements intact.     Pupils: Pupils are equal, round, and reactive to light.  Cardiovascular:     Rate and Rhythm: Normal rate.     Pulses: Normal pulses.  Pulmonary:     Effort: Pulmonary effort is normal.     Breath sounds: Normal breath sounds.  Abdominal:     General: Abdomen is flat.     Palpations: Abdomen is soft.  Genitourinary:    Comments: No obvious penile lesions and no inguinal lymphadenopathy.  No penile rash. Musculoskeletal:        General: Normal range of motion.     Cervical back: Normal range of motion.  Skin:    General: Skin is warm.  Neurological:     General: No focal deficit present.  Mental Status: He is alert.  Psychiatric:        Mood and Affect: Mood normal.        Behavior: Behavior normal.     ED Results / Procedures / Treatments   Labs (all labs ordered are listed, but only abnormal results are displayed) Labs Reviewed  URINALYSIS, ROUTINE W REFLEX MICROSCOPIC - Abnormal; Notable for the following components:      Result Value   Hgb urine dipstick TRACE (*)    All other components within normal limits  URINALYSIS, MICROSCOPIC (REFLEX) - Abnormal; Notable for the following components:   Bacteria, UA RARE (*)    All other components within normal limits  GC/CHLAMYDIA PROBE AMP () NOT AT [facility]    EKG None  Radiology No results found.  Procedures Procedures    Medications Ordered in ED Medications  doxycycline (VIBRA-TABS) tablet 100 mg (has no administration in time range)  cefTRIAXone (ROCEPHIN) injection  500 mg (has no administration in time range)    ED Course/ Medical Decision Making/ A&P                           Medical Decision Making Mahonri Seiden is a 31 y.o. male here with possible STD exposure.  He had unprotected sex 3 days ago and did not know what she has.  He was told that she may have an STD.  We will test for GC chlamydia and will give empiric treatment.  Patient's urinalysis showed rare bacteria and no WBC.  Told him to check MyChart regarding GC chlamydia results.  Will discharge home with doxycycline for now   Problems Addressed: Concern about STD in male without diagnosis: acute illness or injury  Amount and/or Complexity of Data Reviewed Labs: ordered. Decision-making details documented in ED Course.  Risk Prescription drug management.    Final Clinical Impression(s) / ED Diagnoses Final diagnoses:  None    Rx / DC Orders ED Discharge Orders     None         Charlynne Pander, MD 03/22/22 2013

## 2022-03-23 LAB — GC/CHLAMYDIA PROBE AMP (~~LOC~~) NOT AT ARMC
Chlamydia: NEGATIVE
Comment: NEGATIVE
Comment: NORMAL
Neisseria Gonorrhea: NEGATIVE

## 2022-11-02 ENCOUNTER — Emergency Department (HOSPITAL_BASED_OUTPATIENT_CLINIC_OR_DEPARTMENT_OTHER)
Admission: EM | Admit: 2022-11-02 | Discharge: 2022-11-02 | Disposition: A | Discharge status: Home / Self Care | Payer: Self-pay | Attending: Emergency Medicine | Admitting: Emergency Medicine

## 2022-11-02 ENCOUNTER — Emergency Department (HOSPITAL_BASED_OUTPATIENT_CLINIC_OR_DEPARTMENT_OTHER): Payer: Self-pay

## 2022-11-02 ENCOUNTER — Other Ambulatory Visit: Payer: Self-pay

## 2022-11-02 DIAGNOSIS — Y92410 Unspecified street and highway as the place of occurrence of the external cause: Secondary | ICD-10-CM | POA: Diagnosis not present

## 2022-11-02 DIAGNOSIS — R519 Headache, unspecified: Secondary | ICD-10-CM | POA: Diagnosis present

## 2022-11-02 MED ORDER — ACETAMINOPHEN 500 MG PO TABS
1000.0000 mg | ORAL_TABLET | Freq: Once | ORAL | Status: AC
  Administered 2022-11-02: 1000 mg via ORAL
  Filled 2022-11-02: qty 2

## 2022-11-02 MED ORDER — METHOCARBAMOL 500 MG PO TABS: 500.0000 mg | ORAL_TABLET | Freq: Two times a day (BID) | ORAL | 0 refills | Status: AC
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
## 2022-11-02 NOTE — Discharge Instructions (Signed)
It was a pleasure taking care of you today!  Your CT of your head didn't show any concerning emergent findings today. You will feel more sore in the morning. You are prescribed Robaxin (muscle relaxer). Do not drive or operate heavy machinery while taking the muscle relaxer as it can make you sleepy/drowsy. You may take over the counter 600 mg Ibuprofen every 6 hours and alternate with 500 mg Tylenol every 6 hours as needed for pain for no more than 7 days. You may apply ice or heat to affected area for up to 15 minutes at a time. Ensure to place a barrier between your skin and the ice/heat. Return to the Emergency Department if you are experiencing increasing/worsening symptoms.

## 2022-12-14 DEATH — deceased
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
